# Patient Record
Sex: Male | Born: 1950 | Race: White | Hispanic: No | Marital: Married
Health system: Southern US, Community
[De-identification: ages and names within clinical notes are randomized; demographics above are authoritative.]

---

## 2016-03-03 ENCOUNTER — Other Ambulatory Visit: Payer: Self-pay | Admitting: Internal Medicine

## 2016-03-03 DIAGNOSIS — E278 Other specified disorders of adrenal gland: Secondary | ICD-10-CM

## 2016-03-03 DIAGNOSIS — E279 Disorder of adrenal gland, unspecified: Principal | ICD-10-CM

## 2016-03-11 ENCOUNTER — Ambulatory Visit
Admission: RE | Admit: 2016-03-11 | Discharge: 2016-03-11 | Disposition: A | Discharge status: Home / Self Care | Payer: Managed Care, Other (non HMO) | Source: Ambulatory Visit | Attending: Internal Medicine | Admitting: Internal Medicine

## 2016-03-11 DIAGNOSIS — N200 Calculus of kidney: Secondary | ICD-10-CM | POA: Insufficient documentation

## 2016-03-11 DIAGNOSIS — E279 Disorder of adrenal gland, unspecified: Secondary | ICD-10-CM | POA: Diagnosis not present

## 2016-03-11 DIAGNOSIS — E278 Other specified disorders of adrenal gland: Secondary | ICD-10-CM

## 2016-03-11 DIAGNOSIS — K5730 Diverticulosis of large intestine without perforation or abscess without bleeding: Secondary | ICD-10-CM | POA: Insufficient documentation

## 2016-03-11 DIAGNOSIS — R599 Enlarged lymph nodes, unspecified: Secondary | ICD-10-CM | POA: Insufficient documentation

## 2016-03-11 DIAGNOSIS — R911 Solitary pulmonary nodule: Secondary | ICD-10-CM | POA: Insufficient documentation

## 2018-01-10 IMAGING — CT CT ABD-PELV W/O CM
2 of 4 series · 15 of 46 positions shown, 17 images · non-contrast
Comparison: No comparison exams or reports.

CLINICAL DATA: 65-year-old with adrenal nodule noted 1 year ago.
One year follow-up [REDACTED] 12/13/2014. No complaints. No
history of cancer or prior surgery. Initial encounter.

EXAM:
CT ABDOMEN AND PELVIS WITHOUT CONTRAST
TECHNIQUE: Multidetector CT imaging of the abdomen and pelvis was performed
following the standard protocol without IV contrast.

[Series 3: adrenal wo · axial · 0.77mm/px · z∈[-1061,-687]mm · 12 of 214 slices shown, 14 images]
[im 18/214  soft-tissue]
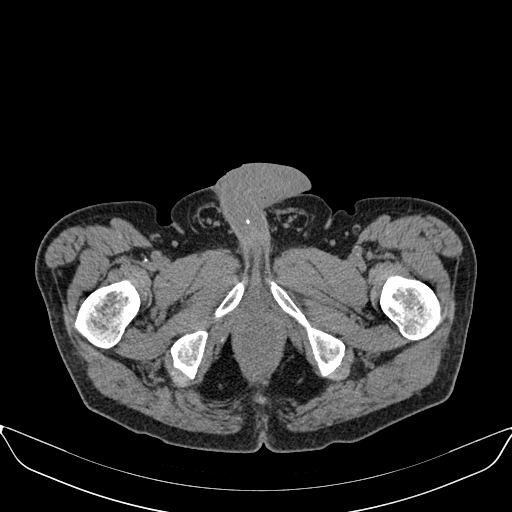
[im 18/214  bone]
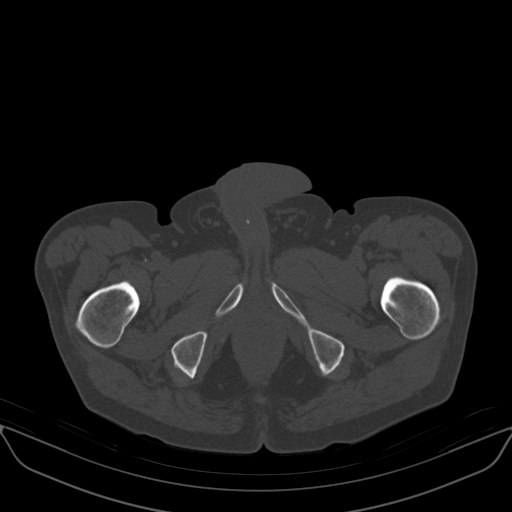
[im 35/214  soft-tissue]
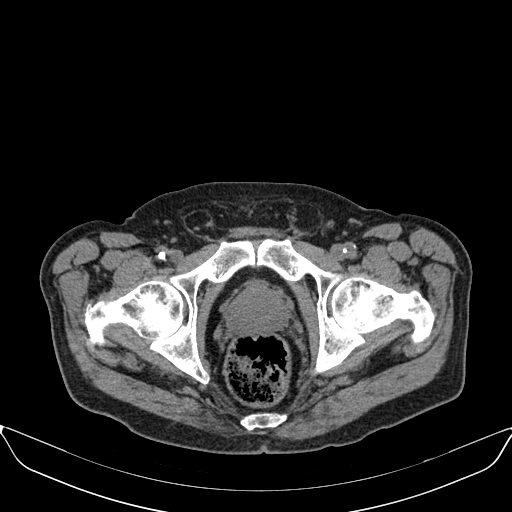
[im 52/214  soft-tissue]
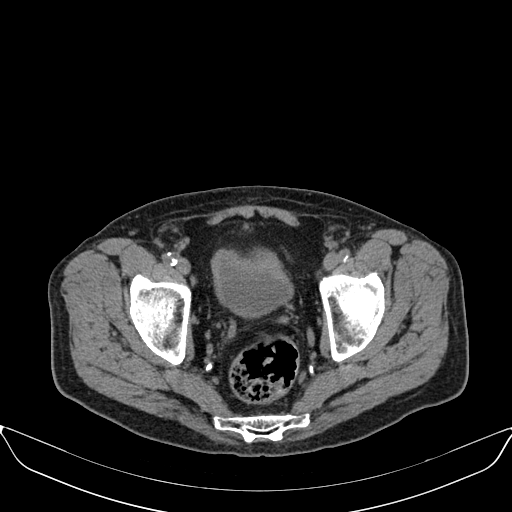
[im 69/214  soft-tissue]
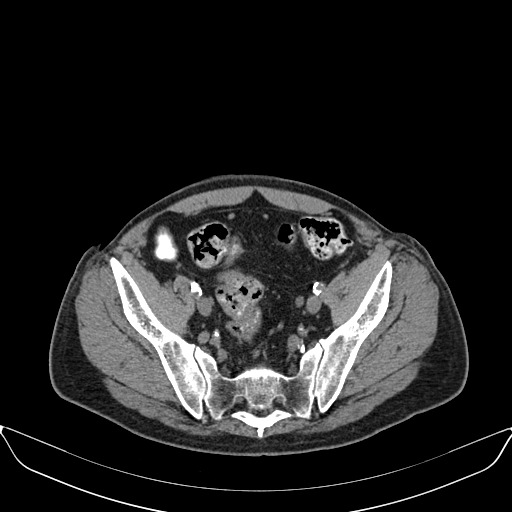
[im 86/214  soft-tissue]
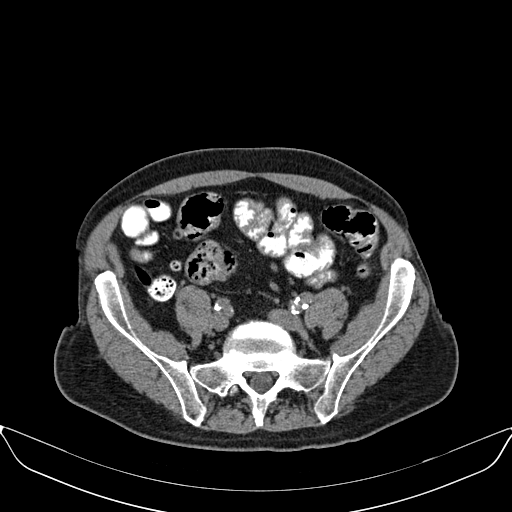
[im 103/214  soft-tissue]
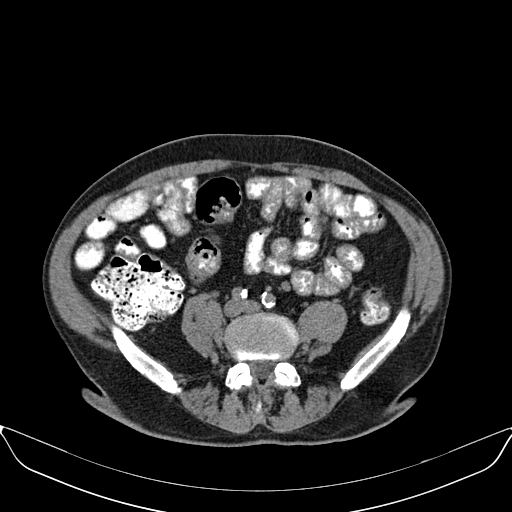
[im 120/214  soft-tissue]
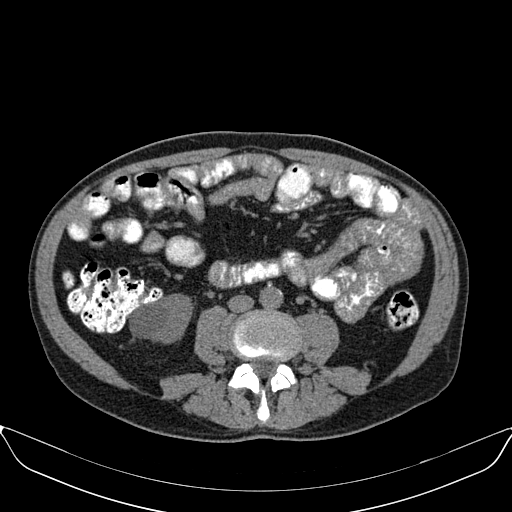
[im 137/214  soft-tissue]
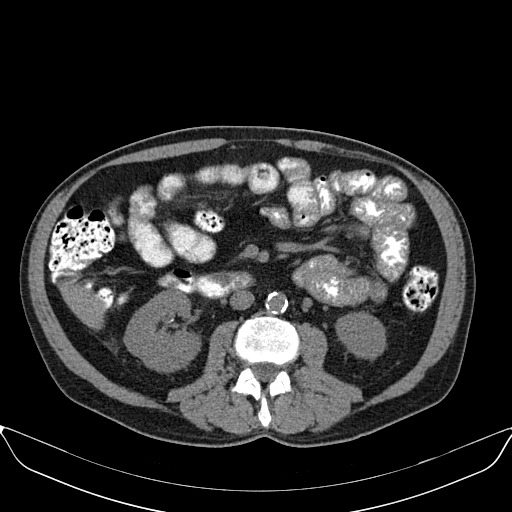
[im 154/214  soft-tissue]
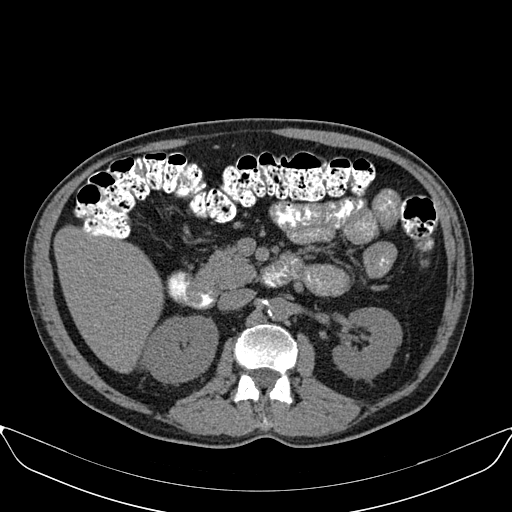
[im 154/214  bone]
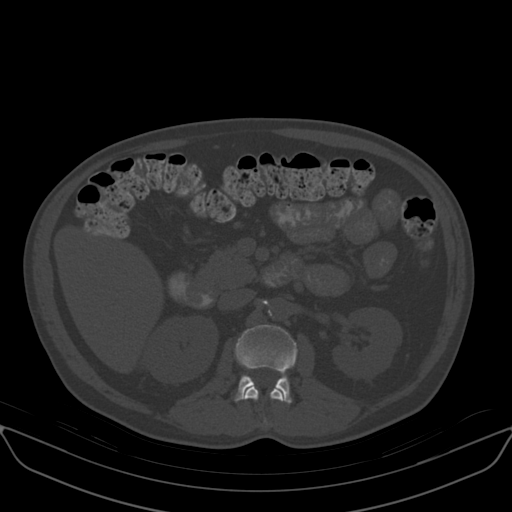
[im 171/214  soft-tissue]
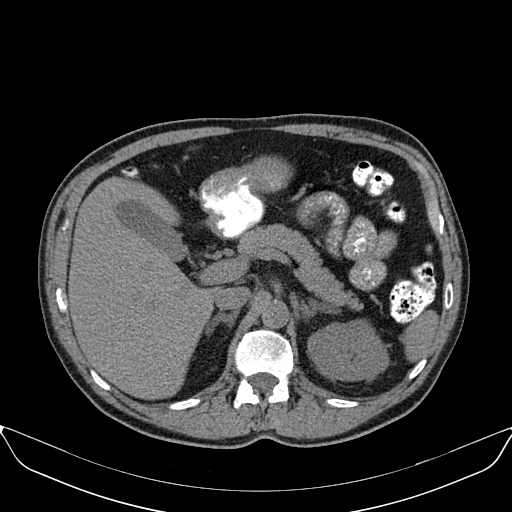
[im 188/214  soft-tissue]
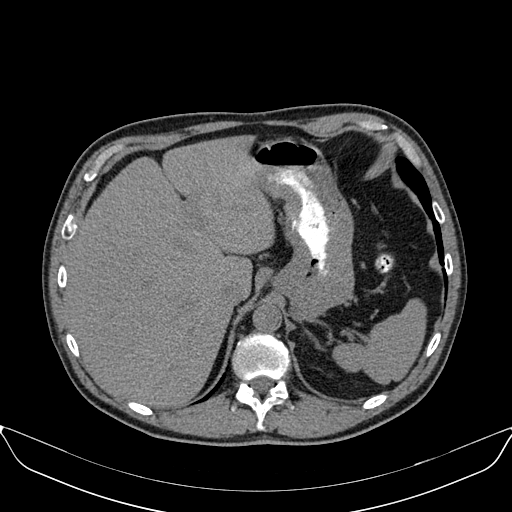
[im 205/214  soft-tissue]
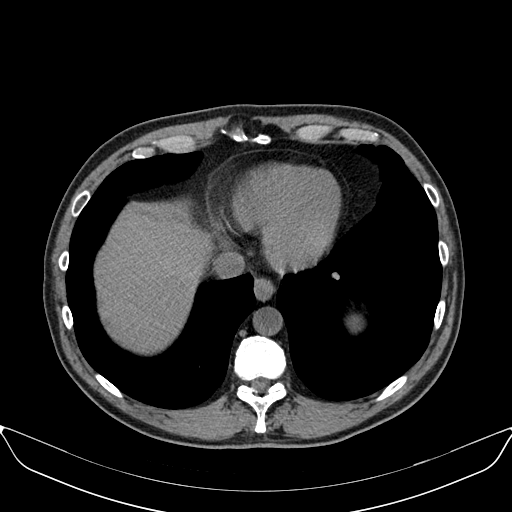

[Series 602: coronal · coronal · 0.83mm/px · 3 of 148 slices shown]
[im 50/148  soft-tissue]
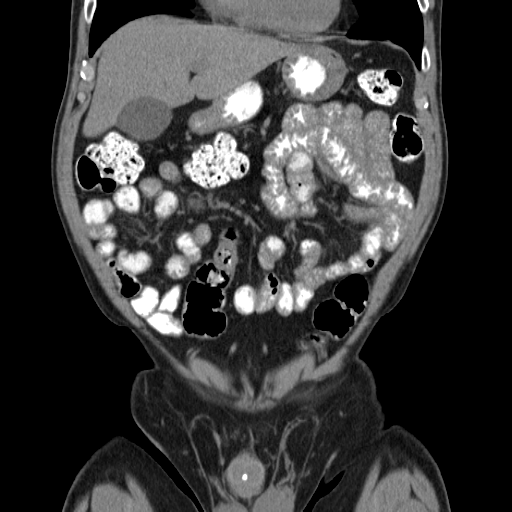
[im 66/148  soft-tissue]
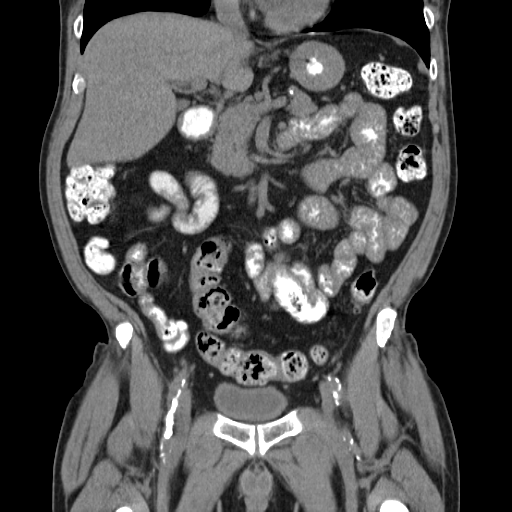
[im 82/148  soft-tissue]
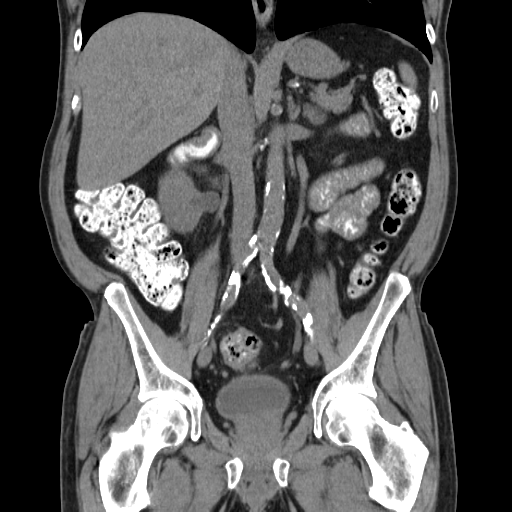

[15 of 46 positions shown; findings below may reference images not displayed]

FINDINGS: Lower chest: Left lung base 1.1 x 0.9 x 1 cm noncalcified nodule.

Hepatobiliary: Taking into account limitation by non contrast
imaging, no worrisome hepatic lesion. No calcified gallstone.

Pancreas: Taking into account limitation by non contrast imaging, no
mass or inflammation.

Spleen: Taking into account limitation by non contrast imaging, no
mass or enlargement.

Adrenals/Urinary Tract: Adrenal gland hyperplasia bilaterally.
Additionally, left adrenal 2.3 cm hypodense nodule with Hounsfield
measurements suggesting this is an adenoma.

Bilateral nonobstructing renal calculi. No hydronephrosis. Right
renal cysts measuring up to 3.2 cm. Upper pole right kidney 5 mm
hyperdense structure may represent a hyperdense cyst. Stability can
be confirmed by comparison with remote exam if available.

Noncontrast filled under distended urinary bladder with
circumferential wall thickening.

Stomach/Bowel: Mild circumferential thickening distal esophagus may
be related to under distension although evaluation limited.
Evaluation of the stomach is limited by under distension. Moderate
stool throughout the colon. Scattered diverticula. No extra luminal
bowel inflammatory process, free fluid or free air.

Vascular/Lymphatic: Moderate calcified plaque aorta and iliac
arteries. Left common iliac artery minimally ectatic compared to the
right measuring up to 1.5 cm. No abdominal aortic aneurysm.

No adenopathy.

Reproductive: Top-normal size prostate gland.

Other: Radiopaque material within the penis possibly vascular in
origin versus foreign body.

Musculoskeletal: No osseous destructive lesion. Small sclerotic
focus left ilium.
IMPRESSION: Adrenal gland hyperplasia bilaterally. Additionally, left adrenal
2.3 cm hypodense nodule with Hounsfield measurements suggesting this
is an adenoma. Recommend correlation with outside exam/report
(currently unavailable).

Left lung base 1.1 x 0.9 x 1 cm noncalcified nodule. As patient
appears to have had an outside CT, correlation with this exam
recommended if possible. If this is not possible than consider one
of the following in 3 months for both low-risk and high-risk
individuals: (a) repeat chest CT, (b) follow-up PET-CT, or (c)
tissue sampling. This recommendation follows the consensus
statement: Guidelines for Management of Incidental Pulmonary Nodules
Detected on CT Images: From the [HOSPITAL] 8064; Radiology
8064; [DATE].

Bilateral nonobstructing renal calculi.

Upper pole right kidney 5 mm hyperdense structure may represent a
hyperdense cyst. Stability can be confirmed by comparison with
remote exam if available.

Mild circumferential thickening distal esophagus may be related to
under distension although evaluation limited. Evaluation of the
stomach is limited by under distension.

Moderate stool throughout the colon. Colonic diverticulosis without
evidence of inflammation.

Radiopaque material within the penis possibly vascular in origin
versus foreign body.

These results will be called to the ordering clinician or
representative by the Radiologist Assistant, and communication
documented in the PACS or zVision Dashboard.

## 2021-12-24 ENCOUNTER — Ambulatory Visit (INDEPENDENT_AMBULATORY_CARE_PROVIDER_SITE_OTHER): Payer: Medicare Other

## 2021-12-24 ENCOUNTER — Ambulatory Visit
Admission: EM | Admit: 2021-12-24 | Discharge: 2021-12-24 | Disposition: A | Discharge status: Home / Self Care | Payer: Medicare Other | Attending: Family Medicine | Admitting: Family Medicine

## 2021-12-24 DIAGNOSIS — M545 Low back pain, unspecified: Secondary | ICD-10-CM

## 2021-12-24 DIAGNOSIS — R109 Unspecified abdominal pain: Secondary | ICD-10-CM

## 2021-12-24 DIAGNOSIS — M5416 Radiculopathy, lumbar region: Secondary | ICD-10-CM

## 2021-12-24 LAB — URINALYSIS, ROUTINE W REFLEX MICROSCOPIC
Bilirubin Urine: NEGATIVE
Glucose, UA: 100 mg/dL — AB
Hgb urine dipstick: NEGATIVE
Leukocytes,Ua: NEGATIVE
Nitrite: NEGATIVE
Protein, ur: 30 mg/dL — AB
Specific Gravity, Urine: 1.02 (ref 1.005–1.030)
pH: 5.5 (ref 5.0–8.0)

## 2021-12-24 LAB — URINALYSIS, MICROSCOPIC (REFLEX)

## 2021-12-24 MED ORDER — TIZANIDINE HCL 4 MG PO TABS: 4.0000 mg | ORAL_TABLET | Freq: Four times a day (QID) | ORAL | 0 refills | Status: AC | PRN

## 2021-12-24 MED ORDER — KETOROLAC TROMETHAMINE 60 MG/2ML IM SOLN
30.0000 mg | Freq: Once | INTRAMUSCULAR | Status: AC
  Administered 2021-12-24: 30 mg via INTRAMUSCULAR

## 2021-12-24 MED ORDER — NAPROXEN 500 MG PO TABS: 500.0000 mg | ORAL_TABLET | Freq: Two times a day (BID) | ORAL | 0 refills | Status: AC

## 2021-12-24 NOTE — ED Provider Notes (Signed)
[facility]    CSN: [id number] Arrival date & time: 12/24/21  1247      History   Chief Complaint Chief Complaint  Patient presents with   Back Pain    HPI  HPI Spiros Greenfeld is a 71 y.o. male.   Danielp presents for right low back pain that started around 10 AM this morning. Pain radiates down to his right foot.  Pain was on the left side but this has resolved.  His foot is numb. Had "excruciating pain" When he got here he almost fell down.  He was given two Tylenol just prior to leaving home which has helped some. Never had anything like this before.   Wife notes that he was raking the yard 2 days ago and yesterday he was performing some yard work.  Patient reports he was moving a lot of mulch bags.  States that he does see his activities often and they are not abnormal for him.  Denies fever, bowel or bladder incontinence, perianal numbness, abdominal pain, dysuria, hematuria, urinary frequency, urinary urgency, neck pain or headache.  Her to today he was well.    No past medical history on file.  There are no problems to display for this patient.        Home Medications    Prior to Admission medications   Medication Sig Start Date End Date Taking? Authorizing Provider  glipiZIDE (GLUCOTROL) 10 MG tablet Take by mouth. 11/21/21 11/21/22 Yes [provider]  lisinopril (ZESTRIL) 5 MG tablet Take by mouth. 11/21/21 11/21/22 Yes [provider]  metoprolol tartrate (LOPRESSOR) 25 MG tablet Take by mouth. 10/09/21 10/09/22 Yes [provider]  naproxen (NAPROSYN) 500 MG tablet Take 1 tablet (500 mg total) by mouth 2 (two) times daily with a meal. 12/24/21  Yes Joakim Huesman, DO  Study - CAPTIVA - aspirin 81 mg tablet (PI-Sethi) Chew 1 tablet by mouth daily. 03/18/10  Yes [provider]  tiZANidine (ZANAFLEX) 4 MG tablet Take 1 tablet (4 mg total) by mouth every 6 (six) hours as needed for muscle spasms. 12/24/21  Yes  Edder Bellanca, DO  metFORMIN (GLUCOPHAGE) 1000 MG tablet Take 1,000 mg by mouth 2 (two) times daily.    [provider]  simvastatin (ZOCOR) 40 MG tablet Take 40 mg by mouth at bedtime.    [provider]    Family History No family history on file.  Social History     Allergies   Patient has no known allergies.   Review of Systems Review of Systems: egative unless otherwise stated in HPI.      Physical Exam Triage Vital Signs ED Triage Vitals  Enc Vitals Group     BP 12/24/21 1258 (!) 148/82     Pulse Rate 12/24/21 1258 91     Resp 12/24/21 1258 16     Temp 12/24/21 1258 98.1 F (36.7 C)     Temp Source 12/24/21 1258 Oral     SpO2 12/24/21 1258 100 %     Weight --      Height --      Head Circumference --      Peak Flow --      Pain Score 12/24/21 1256 8     Pain Loc --      Pain Edu? --      Excl. in GC? --    No data found.  Updated Vital Signs BP (!) 148/82 (BP Location: Left Arm)   Pulse  91   Temp 98.1 F (36.7 C) (Oral)   Resp 16   SpO2 100%   Visual Acuity Right Eye Distance:   Left Eye Distance:   Bilateral Distance:    Right Eye Near:   Left Eye Near:    Bilateral Near:     Physical Exam GEN: well appearing male in no acute distress  CVS: well perfused  RESP: speaking in full sentences without pause, no respiratory distress  MSK:  Lumbar spine: - Inspection: no gross deformity or asymmetry, swelling or ecchymosis. No skin changes  - Palpation: No TTP over the lumbar spinous processes, right lumbar paraspinal muscles tenderness, no SI joint tenderness bilaterally - ROM: full active ROM of the lumbar spine in flexion and extension but with mild pain  - Strength: 5/5 strength of lower extremity in L4-S1 nerve root distributions b/l - Neuro: sensation intact in the L4-S1 nerve root distribution b/l SKIN: warm, dry, no overly skin rash or erythema    UC Treatments / Results  Labs (all labs ordered are listed, but only  abnormal results are displayed) Labs Reviewed  URINALYSIS, ROUTINE W REFLEX MICROSCOPIC - Abnormal; Notable for the following components:      Result Value   Glucose, UA 100 (*)    Ketones, ur TRACE (*)    Protein, ur 30 (*)    All other components within normal limits  URINALYSIS, MICROSCOPIC (REFLEX) - Abnormal; Notable for the following components:   Bacteria, UA FEW (*)    All other components within normal limits    EKG   Radiology CT Renal Stone Study  Result Date: 12/24/2021 CLINICAL DATA:  Right flank pain. EXAM: CT ABDOMEN AND PELVIS WITHOUT CONTRAST TECHNIQUE: Multidetector CT imaging of the abdomen and pelvis was performed following the standard protocol without IV contrast. RADIATION DOSE REDUCTION: This exam was performed according to the departmental dose-optimization program which includes automated exposure control, adjustment of the mA and/or kV according to patient size and/or use of iterative reconstruction technique. COMPARISON:  CT abdomen pelvis dated March 11, 2016. FINDINGS: Lower chest: No acute abnormality. Unchanged 1.0 cm nodule in the central left lower lobe, stable since 2018, benign. No follow-up imaging is recommended. Hepatobiliary: No focal liver abnormality is seen. No gallstones, gallbladder wall thickening, or biliary dilatation. Pancreas: Unremarkable. No pancreatic ductal dilatation or surrounding inflammatory changes. Spleen: Normal in size without focal abnormality. Adrenals/Urinary Tract: Unchanged bilateral adrenal gland thickening and 2.2 cm left adrenal adenoma. No follow-up imaging is recommended. Unchanged punctate bilateral renal calculi and renovascular calcifications. Slight interval increase in size of a 7 mm hemorrhagic cyst in the upper pole of the right kidney, previously 5 mm. Mild interval enlargement and multiple right renal simple cysts measuring up to 3.6 cm. No follow-up imaging is recommended. No hydronephrosis. The bladder is  unremarkable for the degree of distention. Stomach/Bowel: Stomach is within normal limits. Appendix appears normal. No evidence of bowel wall thickening, distention, or inflammatory changes. Sigmoid colonic diverticulosis. Vascular/Lymphatic: Aortic atherosclerosis. No enlarged abdominal or pelvic lymph nodes. Reproductive: New mild prostatomegaly. Other: No free fluid or pneumoperitoneum. Musculoskeletal: No acute or significant osseous findings. IMPRESSION: 1. No acute intra-abdominal process. 2. Unchanged bilateral punctate nonobstructive nephrolithiasis. 3.  Aortic Atherosclerosis (ICD10-I70.0). Electronically Signed   By: Obie DredgeWilliam T Derry M.D.   On: 12/24/2021 15:06   DG Lumbar Spine Complete  Result Date: 12/24/2021 CLINICAL DATA:  Right lower back pain radiating to the right leg EXAM: LUMBAR SPINE - COMPLETE 4+ VIEW COMPARISON:  03/11/2016 FINDINGS: Linear and branching calcifications along the renal hila, previously shown to be vascular. Strictly speaking I cannot exclude superimposed renal calculus. Atherosclerosis is present, including aortoiliac atherosclerotic disease. Preserved intervertebral disc spaces without subluxation or findings of lumbar spine fracture. Prominent stool throughout the colon favors constipation. IMPRESSION: 1. No significant lumbar spine conventional radiographic abnormality observed. 2. Prominent stool throughout the colon favors constipation. 3. Linear and branching calcifications along the renal hila, previously shown to be vascular. Strictly speaking, I cannot exclude superimposed renal calculus. 4. Atherosclerosis. Electronically Signed   By: Gaylyn Rong M.D.   On: 12/24/2021 13:57    Procedures Procedures (including critical care time)  Medications Ordered in UC Medications  ketorolac (TORADOL) injection 30 mg (30 mg Intramuscular Given 12/24/21 1442)    Initial Impression / Assessment and Plan / UC Course  I have reviewed the triage vital signs and the  nursing notes.  Pertinent labs & imaging results that were available during my care of the patient were reviewed by me and considered in my medical decision making (see chart for details).      Pt is a 71 y.o.  male with acute onset right low back pain that started today.  Obtain lumbar films that were concerning for possible overlying kidney stone versus branching calcifications of the renal hilar that might be vascular in nature.  There is no significant lumbar spine abnormality noted.  Patient given IM Toradol with some relief.  On chart review, he had a CT in February 2018, that showed bilateral kidney stones.  Obtain CT renal after shared decision making with the patient and his wife.  Urinalysis without acute cystitis or hematuria.  CT showed unchanged bilateral punctate nonobstructing kidney stones were seen.   Patient to gradually return to normal activities, as tolerated and continue ordinary activities within the limits permitted by pain. Prescribed Naproxen sodium  and muscle relaxer  for pain relief.  Advised patient to avoid other NSAIDs while taking Naprosyn. Tylenol and Lidocaine patches PRN for multimodal pain relief.  Avoid lifting anything greater than 10 pounds in the next 1 to 2 weeks.  Counseled patient on red flag symptoms and when to seek immediate care.  No red flags suggesting cauda equina syndrome or progressive major motor weakness. Patient to follow up with orthopedic provider if symptoms do not improve with conservative treatment.  Return and ED precautions given.   Discussed MDM, treatment plan and plan for follow-up with patient/parent who agrees with plan.   Final Clinical Impressions(s) / UC Diagnoses   Final diagnoses:  Lumbar radiculopathy     Discharge Instructions      Your kidney stones are unchanged and are not blocking your urinary flow.  I do not think this is the cause of your pain.  As discussed, limit how much you carry over the next 2 weeks.  Try  not to lift anything greater than 10 pounds.  Stop by the pharmacy to pick up your muscle relaxers and pain medication.  You can use Tylenol 1000 mg ( 2 tablets) 3 times a day for additional pain relief.   If your pain does not improve in the next 2 weeks be sure to follow-up with an orthopedic provider.  [facility] in [place] can see you in the same day.     ED Prescriptions     Medication Sig Dispense Auth. Provider   naproxen (NAPROSYN) 500 MG tablet Take 1 tablet (500 mg total) by mouth 2 (two) times daily  with a meal. 30 tablet Yaremi Stahlman, DO   tiZANidine (ZANAFLEX) 4 MG tablet Take 1 tablet (4 mg total) by mouth every 6 (six) hours as needed for muscle spasms. 30 tablet Katha Cabal, DO      PDMP not reviewed this encounter.   Katha Cabal, DO 12/24/21 2101

## 2021-12-24 NOTE — ED Triage Notes (Signed)
Pt presents with right side lower back pain that radiates down his right leg. He has taken tylenol for pain and it has not helped.

## 2021-12-24 NOTE — Discharge Instructions (Addendum)
Your kidney stones are unchanged and are not blocking your urinary flow.  I do not think this is the cause of your pain.  As discussed, limit how much you carry over the next 2 weeks.  Try not to lift anything greater than 10 pounds.  Stop by the pharmacy to pick up your muscle relaxers and pain medication.  You can use Tylenol 1000 mg ( 2 tablets) 3 times a day for additional pain relief.   If your pain does not improve in the next 2 weeks be sure to follow-up with an orthopedic provider.  [facility] in [place] can see you in the same day.
